# Patient Record
Sex: Female | Born: 1965 | Race: White | Hispanic: No | Marital: Married | Smoking: Never smoker
Health system: Southern US, Community
[De-identification: ages and names within clinical notes are randomized; demographics above are authoritative.]

## PROBLEM LIST (undated history)

## (undated) DIAGNOSIS — F329 Major depressive disorder, single episode, unspecified: Secondary | ICD-10-CM

## (undated) DIAGNOSIS — F32A Depression, unspecified: Secondary | ICD-10-CM

## (undated) DIAGNOSIS — I10 Essential (primary) hypertension: Secondary | ICD-10-CM

## (undated) HISTORY — PX: TONSILLECTOMY: SUR1361

## (undated) HISTORY — PX: OOPHORECTOMY: SHX86

## (undated) HISTORY — PX: ABDOMINAL HYSTERECTOMY: SHX81

## (undated) HISTORY — PX: BREAST CYST EXCISION: SHX579

---

## 2001-02-17 ENCOUNTER — Ambulatory Visit (HOSPITAL_COMMUNITY): Admission: RE | Admit: 2001-02-17 | Discharge: 2001-02-17 | Payer: Self-pay

## 2006-03-30 ENCOUNTER — Emergency Department: Payer: Self-pay | Admitting: Emergency Medicine

## 2006-04-25 ENCOUNTER — Ambulatory Visit: Payer: Self-pay | Admitting: Internal Medicine

## 2007-03-06 ENCOUNTER — Ambulatory Visit: Payer: Self-pay

## 2009-02-03 ENCOUNTER — Ambulatory Visit: Payer: Self-pay | Admitting: Obstetrics and Gynecology

## 2010-02-13 ENCOUNTER — Ambulatory Visit: Payer: Self-pay

## 2011-03-05 ENCOUNTER — Ambulatory Visit: Payer: Self-pay | Admitting: Family Medicine

## 2011-03-08 ENCOUNTER — Ambulatory Visit: Payer: Self-pay | Admitting: Family Medicine

## 2012-01-24 DIAGNOSIS — M24859 Other specific joint derangements of unspecified hip, not elsewhere classified: Secondary | ICD-10-CM | POA: Insufficient documentation

## 2012-01-24 DIAGNOSIS — M25559 Pain in unspecified hip: Secondary | ICD-10-CM | POA: Insufficient documentation

## 2012-12-02 ENCOUNTER — Ambulatory Visit: Payer: Self-pay | Admitting: Family Medicine

## 2012-12-09 DIAGNOSIS — F329 Major depressive disorder, single episode, unspecified: Secondary | ICD-10-CM | POA: Insufficient documentation

## 2014-01-07 ENCOUNTER — Ambulatory Visit: Payer: Self-pay | Admitting: Family Medicine

## 2014-03-30 ENCOUNTER — Ambulatory Visit: Payer: Self-pay | Admitting: Family Medicine

## 2014-10-15 DIAGNOSIS — S61409A Unspecified open wound of unspecified hand, initial encounter: Secondary | ICD-10-CM | POA: Insufficient documentation

## 2014-10-15 DIAGNOSIS — IMO0002 Reserved for concepts with insufficient information to code with codable children: Secondary | ICD-10-CM | POA: Insufficient documentation

## 2014-10-15 DIAGNOSIS — S66999A Other injury of unspecified muscle, fascia and tendon at wrist and hand level, unspecified hand, initial encounter: Secondary | ICD-10-CM

## 2015-06-15 DIAGNOSIS — G5620 Lesion of ulnar nerve, unspecified upper limb: Secondary | ICD-10-CM | POA: Insufficient documentation

## 2015-07-15 DIAGNOSIS — R200 Anesthesia of skin: Secondary | ICD-10-CM | POA: Insufficient documentation

## 2015-09-19 ENCOUNTER — Ambulatory Visit
Admission: EM | Admit: 2015-09-19 | Discharge: 2015-09-19 | Disposition: A | Discharge status: Home / Self Care | Payer: BLUE CROSS/BLUE SHIELD | Attending: Family Medicine | Admitting: Family Medicine

## 2015-09-19 ENCOUNTER — Encounter: Payer: Self-pay | Admitting: Gynecology

## 2015-09-19 DIAGNOSIS — J0100 Acute maxillary sinusitis, unspecified: Secondary | ICD-10-CM | POA: Diagnosis not present

## 2015-09-19 DIAGNOSIS — R05 Cough: Secondary | ICD-10-CM | POA: Diagnosis not present

## 2015-09-19 DIAGNOSIS — R059 Cough, unspecified: Secondary | ICD-10-CM

## 2015-09-19 HISTORY — DX: Depression, unspecified: F32.A

## 2015-09-19 HISTORY — DX: Major depressive disorder, single episode, unspecified: F32.9

## 2015-09-19 HISTORY — DX: Essential (primary) hypertension: I10

## 2015-09-19 MED ORDER — AZITHROMYCIN 250 MG PO TABS: ORAL_TABLET | ORAL | Status: DC

## 2015-09-19 MED ORDER — GUAIFENESIN-CODEINE 100-10 MG/5ML PO SOLN: ORAL | Status: AC

## 2015-09-19 NOTE — ED Notes (Signed)
Patient c/o cough x 10 days. Per patient left side pain when coughing.

## 2015-09-19 NOTE — ED Provider Notes (Signed)
CSN: [id number]     Arrival date & time 09/19/15  1008 History   First MD Initiated Contact with Patient 09/19/15 1111     Chief Complaint  Patient presents with  . Cough   (Consider location/radiation/quality/duration/timing/severity/associated sxs/prior Treatment) Patient is a 50 y.o. female presenting with cough. The history is provided by the patient.  Cough Cough characteristics:  Productive Sputum characteristics:  Yellow Severity:  Moderate Onset quality:  Sudden Duration:  10 days Timing:  Constant Progression:  Worsening Chronicity:  New Smoker: no   Context: upper respiratory infection   Relieved by:  Nothing Associated symptoms: chills, fever, headaches, shortness of breath and sinus congestion     Past Medical History  Diagnosis Date  . Hypertension   . Depression    Past Surgical History  Procedure Laterality Date  . Abdominal hysterectomy    . Tonsillectomy     No family history on file. Social History  Substance Use Topics  . Smoking status: Never Smoker   . Smokeless tobacco: None  . Alcohol Use: Yes   OB History    No data available     Review of Systems  Constitutional: Positive for fever and chills.  Respiratory: Positive for cough and shortness of breath.   Neurological: Positive for headaches.    Allergies  Review of patient's allergies indicates no known allergies.  Home Medications   Prior to Admission medications   Medication Sig Start Date End Date Taking? Authorizing Provider  gabapentin (NEURONTIN) 300 MG capsule Take 300 mg by mouth 3 (three) times daily.   Yes Historical Provider, MD  venlafaxine XR (EFFEXOR-XR) 150 MG 24 hr capsule Take 150 mg by mouth daily with breakfast.   Yes Historical Provider, MD  azithromycin (ZITHROMAX Z-PAK) 250 MG tablet 2 tabs po once day 1, then 1 tab po qd for next 4 days 09/19/15   Payton Mccallum, MD  guaiFENesin-codeine 100-10 MG/5ML syrup 10 ml po q 8 hours prn 09/19/15   Payton Mccallum, MD   Meds  Ordered and Administered this Visit  Medications - No data to display  BP 154/100 mmHg  Pulse 86  Temp(Src) 98.5 F (36.9 C) (Oral)  Resp 18  Ht 5\' 2"  (1.575 m)  Wt 217 lb (98.431 kg)  BMI 39.68 kg/m2  SpO2 100% No data found.   Physical Exam  Constitutional: She appears well-developed and well-nourished. No distress.  HENT:  Head: Normocephalic and atraumatic.  Right Ear: Tympanic membrane, external ear and ear canal normal.  Left Ear: Tympanic membrane, external ear and ear canal normal.  Nose: Mucosal edema and rhinorrhea present. No nose lacerations, sinus tenderness, nasal deformity, septal deviation or nasal septal hematoma. No epistaxis.  No foreign bodies. Right sinus exhibits maxillary sinus tenderness and frontal sinus tenderness. Left sinus exhibits maxillary sinus tenderness and frontal sinus tenderness.  Mouth/Throat: Uvula is midline, oropharynx is clear and moist and mucous membranes are normal. No oropharyngeal exudate.  Eyes: Conjunctivae and EOM are normal. Pupils are equal, round, and reactive to light. Right eye exhibits no discharge. Left eye exhibits no discharge. No scleral icterus.  Neck: Normal range of motion. Neck supple. No thyromegaly present.  Cardiovascular: Normal rate, regular rhythm and normal heart sounds.   Pulmonary/Chest: Effort normal. No respiratory distress. She has no wheezes. She has no rales.  Diffuse rhonchi; worse on left side  Lymphadenopathy:    She has no cervical adenopathy.  Skin: She is not diaphoretic.  Nursing note and vitals reviewed.  ED Course  Procedures (including critical care time)  Labs Review Labs Reviewed - No data to display  Imaging Review No results found.   Visual Acuity Review  Right Eye Distance:   Left Eye Distance:   Bilateral Distance:    Right Eye Near:   Left Eye Near:    Bilateral Near:         MDM   1. Acute maxillary sinusitis, recurrence not specified   2. Cough      Discharge Medication List as of 09/19/2015 11:21 AM    START taking these medications   Details  azithromycin (ZITHROMAX Z-PAK) 250 MG tablet 2 tabs po once day 1, then 1 tab po qd for next 4 days, Normal    guaiFENesin-codeine 100-10 MG/5ML syrup 10 ml po q 8 hours prn, Print       1. diagnosis reviewed with patient 2. rx as per orders above; reviewed possible side effects, interactions, risks and benefits  3. Recommend supportive treatment with otc flonase, otc analgesics prn, increased fluids 4. Follow-up prn if symptoms worsen or don't improve    Payton Mccallumrlando Jasiel Apachito, MD 09/19/15 1209

## 2015-12-30 ENCOUNTER — Telehealth: Payer: Self-pay | Admitting: Gastroenterology

## 2015-12-30 NOTE — Telephone Encounter (Signed)
Left a voice message at work for patient to call back and schedule appointment with Dr. Servando SnareWohl for abdominal pain, right upper quadrant. I called her cell phone 4/20 abd 4/21 but her mailbox is full and I can't leave a message

## 2016-02-07 ENCOUNTER — Ambulatory Visit: Payer: Self-pay | Admitting: Gastroenterology

## 2016-02-27 ENCOUNTER — Other Ambulatory Visit: Payer: Self-pay | Admitting: Family Medicine

## 2016-02-27 DIAGNOSIS — Z1231 Encounter for screening mammogram for malignant neoplasm of breast: Secondary | ICD-10-CM

## 2016-02-29 DIAGNOSIS — G5601 Carpal tunnel syndrome, right upper limb: Secondary | ICD-10-CM | POA: Insufficient documentation

## 2016-02-29 DIAGNOSIS — Z85828 Personal history of other malignant neoplasm of skin: Secondary | ICD-10-CM | POA: Insufficient documentation

## 2016-02-29 DIAGNOSIS — J4520 Mild intermittent asthma, uncomplicated: Secondary | ICD-10-CM | POA: Insufficient documentation

## 2016-03-01 ENCOUNTER — Encounter: Payer: Self-pay | Admitting: Gastroenterology

## 2016-03-01 ENCOUNTER — Other Ambulatory Visit: Payer: Self-pay

## 2016-03-01 ENCOUNTER — Ambulatory Visit (INDEPENDENT_AMBULATORY_CARE_PROVIDER_SITE_OTHER): Payer: BLUE CROSS/BLUE SHIELD | Admitting: Gastroenterology

## 2016-03-01 VITALS — BP 154/89 | HR 91 | Temp 98.0°F | Ht 62.0 in | Wt 237.0 lb

## 2016-03-01 DIAGNOSIS — R1010 Upper abdominal pain, unspecified: Secondary | ICD-10-CM

## 2016-03-01 DIAGNOSIS — K219 Gastro-esophageal reflux disease without esophagitis: Secondary | ICD-10-CM | POA: Diagnosis not present

## 2016-03-01 DIAGNOSIS — R1011 Right upper quadrant pain: Principal | ICD-10-CM

## 2016-03-01 DIAGNOSIS — G8929 Other chronic pain: Secondary | ICD-10-CM | POA: Diagnosis not present

## 2016-03-01 NOTE — Progress Notes (Signed)
Gastroenterology Consultation  Referring Provider:     Rolm GalaGrandis, Heidi, MD Primary Care Physician:  Rolm GalaGRANDIS, HEIDI, MD Primary Gastroenterologist:  Dr. Servando SnareWohl     Reason for Consultation:     Abdominal pain        HPI:   Jennifer Todd is a 50 y.o. y/o female referred for consultation & management of Abdominal pain by Dr. Gavin PottersGRANDIS, Trudie BucklerHEIDI, MD.  This patient comes here today with a report of abdominal pain that started in the epigastric area and radiated to the right.  The patient states that she has had daily heartburn for the last few months but was put on a PPI and her heartburn has completely resolved.  Since starting the PPI she states she has only had 2 attacks of pain in the right side which is a stabbing pain.  There is no report of any unexplained weight loss, fevers, chills, nausea or vomiting. She denies ever having these symptoms prior to the last few months.  Past Medical History  Diagnosis Date  . Hypertension   . Depression     Past Surgical History  Procedure Laterality Date  . Abdominal hysterectomy    . Tonsillectomy      Prior to Admission medications   Medication Sig Start Date End Date Taking? Authorizing Provider  albuterol [facility](PROAIR HFA) [phone](90 Base) MCG/ACT inhaler  12/23/15 12/22/16 Yes Historical Provider, MD  EPINEPHrine (EPIPEN 2-PAK) 0.3 mg/0.3 mL IJ SOAJ injection  12/23/15  Yes Historical Provider, MD  pantoprazole (PROTONIX) 40 MG tablet  12/23/15  Yes Historical Provider, MD  SUMAtriptan (IMITREX) 100 MG tablet  02/27/16  Yes Historical Provider, MD  azithromycin (ZITHROMAX Z-PAK) 250 MG tablet 2 tabs po once day 1, then 1 tab po qd for next 4 days 09/19/15   Payton Mccallumrlando Conty, MD  gabapentin (NEURONTIN) 300 MG capsule Take 300 mg by mouth 3 (three) times daily.    Historical Provider, MD  guaiFENesin-codeine 100-10 MG/5ML syrup 10 ml po q 8 hours prn 09/19/15   Payton Mccallumrlando Conty, MD  Multiple Vitamin (MULTIVITAMIN) capsule Take by mouth.    Historical Provider, MD    venlafaxine XR (EFFEXOR-XR) 150 MG 24 hr capsule Take 150 mg by mouth daily with breakfast.    Historical Provider, MD    Family History  Problem Relation Age of Onset  . Cancer Father   . Hypertension Father   . Diabetes Father   . Hypothyroidism Mother      Social History  Substance Use Topics  . Smoking status: Never Smoker   . Smokeless tobacco: Never Used  . Alcohol Use: Yes    Allergies as of 03/01/2016 - Review Complete 03/01/2016  Allergen Reaction Noted  . Latex Rash 02/29/2016  . Oxycodone Hives and Rash 02/29/2016    Review of Systems:    All systems reviewed and negative except where noted in HPI.   Physical Exam:  BP 154/89 mmHg  Pulse 91  Temp(Src) 98 F (36.7 C) (Oral)  Ht 5\' 2"  (1.575 m)  Wt 237 lb (107.502 kg)  BMI 43.34 kg/m2 No LMP recorded. Patient has had a hysterectomy. Psych:  Alert and cooperative. Normal mood and affect. General:   Alert,  Well-developed, Obese,well-nourished, pleasant and cooperative in NAD Head:  Normocephalic and atraumatic. Eyes:  Sclera clear, no icterus.   Conjunctiva pink. Ears:  Normal auditory acuity. Nose:  No deformity, discharge, or lesions. Mouth:  No deformity or lesions,oropharynx pink & moist. Neck:  Supple; no masses  or thyromegaly. Lungs:  Respirations even and unlabored.  Clear throughout to auscultation.   No wheezes, crackles, or rhonchi. No acute distress. Heart:  Regular rate and rhythm; no murmurs, clicks, rubs, or gallops. Abdomen:  Normal bowel sounds.  No bruits.  Soft, non-tender and non-distended without masses, hepatosplenomegaly or hernias noted.  No guarding or rebound tenderness.  Negative Carnett sign.   Rectal:  Deferred.  Msk:  Symmetrical without gross deformities.  Good, equal movement & strength bilaterally. Pulses:  Normal pulses noted. Extremities:  No clubbing or edema.  No cyanosis. Neurologic:  Alert and oriented x3;  grossly normal neurologically. Skin:  Intact without  significant lesions or rashes.  No jaundice. Lymph Nodes:  No significant cervical adenopathy. Psych:  Alert and cooperative. Normal mood and affect.  Imaging Studies: No results found.  Assessment and Plan:   Jennifer Todd is a 50 y.o. y/o female Who comes in today with a history of heartburn and right upper quadrant pain.  The patient has no reproducible pain at the present time.  She states that it has also gotten better since she started taking her PPI.  Due to the patient's symptoms and obesity I have explained to her that I'm ordering a right upper quadrant ultrasound to make sure she does not have any gallbladder disease.  Otherwise the patient has been told that she will have no further workup because the pain has improved to the point where she is only had 2 episodes recently.  The patient has been explained the plan and agrees with it.   Note: This dictation was prepared with Dragon dictation along with smaller phrase technology. Any transcriptional errors that result from this process are unintentional.

## 2016-03-01 NOTE — Patient Instructions (Signed)
[facility] at 9:30am. Nothing to eat or drink after midnight.

## 2016-03-19 ENCOUNTER — Ambulatory Visit
Admission: RE | Admit: 2016-03-19 | Discharge: 2016-03-19 | Disposition: A | Discharge status: Home / Self Care | Payer: BLUE CROSS/BLUE SHIELD | Source: Ambulatory Visit | Attending: Gastroenterology | Admitting: Gastroenterology

## 2016-03-19 ENCOUNTER — Ambulatory Visit
Admission: RE | Admit: 2016-03-19 | Discharge: 2016-03-19 | Disposition: A | Discharge status: Home / Self Care | Payer: BLUE CROSS/BLUE SHIELD | Source: Ambulatory Visit | Attending: Family Medicine | Admitting: Family Medicine

## 2016-03-19 DIAGNOSIS — Z1231 Encounter for screening mammogram for malignant neoplasm of breast: Secondary | ICD-10-CM | POA: Diagnosis not present

## 2016-03-19 DIAGNOSIS — R1011 Right upper quadrant pain: Secondary | ICD-10-CM | POA: Insufficient documentation

## 2016-03-20 ENCOUNTER — Telehealth: Payer: Self-pay

## 2016-03-20 NOTE — Telephone Encounter (Signed)
-----   Message from Midge Miniumarren Wohl, MD sent at 03/19/2016 12:16 PM EDT ----- Let the patient know that the ultrasound was normal and no further workup is needed unless she has any further symptoms.

## 2016-03-20 NOTE — Telephone Encounter (Signed)
Left vm letting pt know the results of US and to contact me with any further issues.

## 2018-06-18 ENCOUNTER — Other Ambulatory Visit: Payer: Self-pay | Admitting: Family Medicine

## 2018-06-18 DIAGNOSIS — Z1231 Encounter for screening mammogram for malignant neoplasm of breast: Secondary | ICD-10-CM

## 2018-06-30 ENCOUNTER — Ambulatory Visit
Admission: RE | Admit: 2018-06-30 | Discharge: 2018-06-30 | Disposition: A | Discharge status: Home / Self Care | Payer: BLUE CROSS/BLUE SHIELD | Source: Ambulatory Visit | Attending: Family Medicine | Admitting: Family Medicine

## 2018-06-30 DIAGNOSIS — Z1231 Encounter for screening mammogram for malignant neoplasm of breast: Secondary | ICD-10-CM | POA: Insufficient documentation

## 2019-01-05 IMAGING — MG DIGITAL SCREENING BILATERAL MAMMOGRAM WITH TOMO AND CAD
8 series · 9 of 24 positions shown · non-contrast
Comparison: Previous exam(s).

CLINICAL DATA: Screening. History of 102 pound weight loss
following gastric bypass surgery.

EXAM:
DIGITAL SCREENING BILATERAL MAMMOGRAM WITH TOMO AND CAD

[R MLO synth-2D]
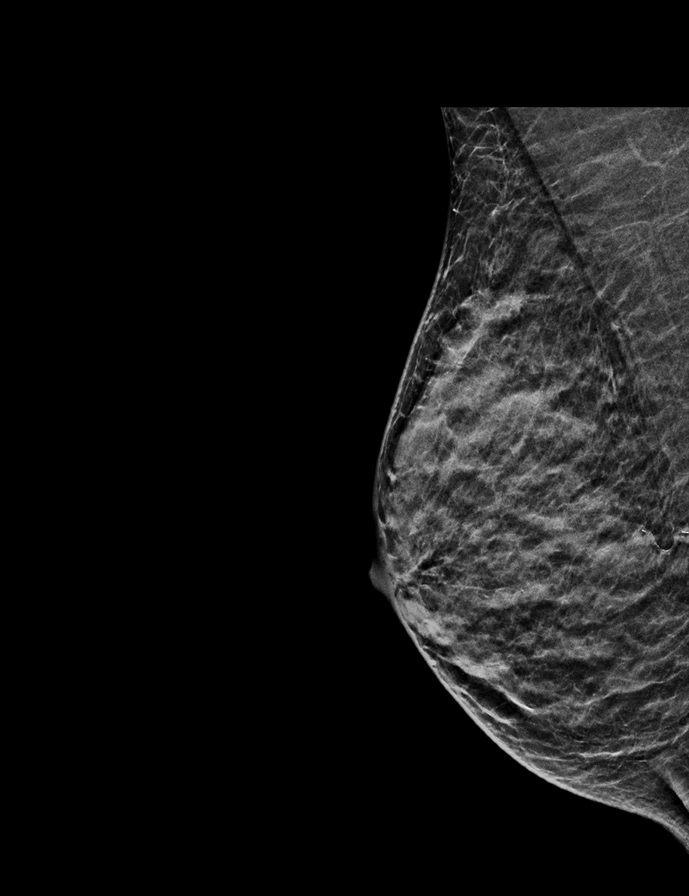

[L MLO synth-2D]
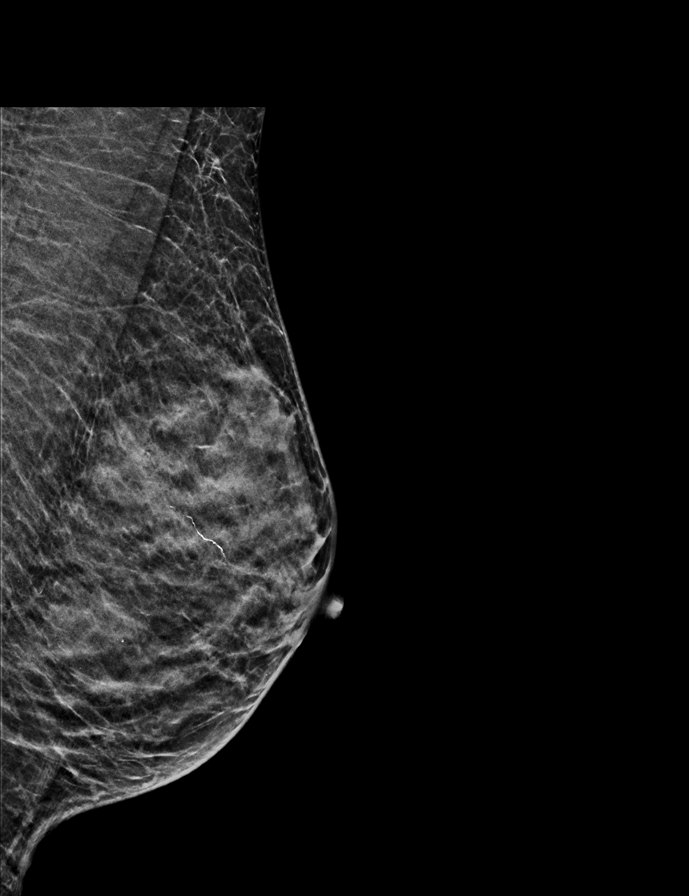

[L CC synth-2D]
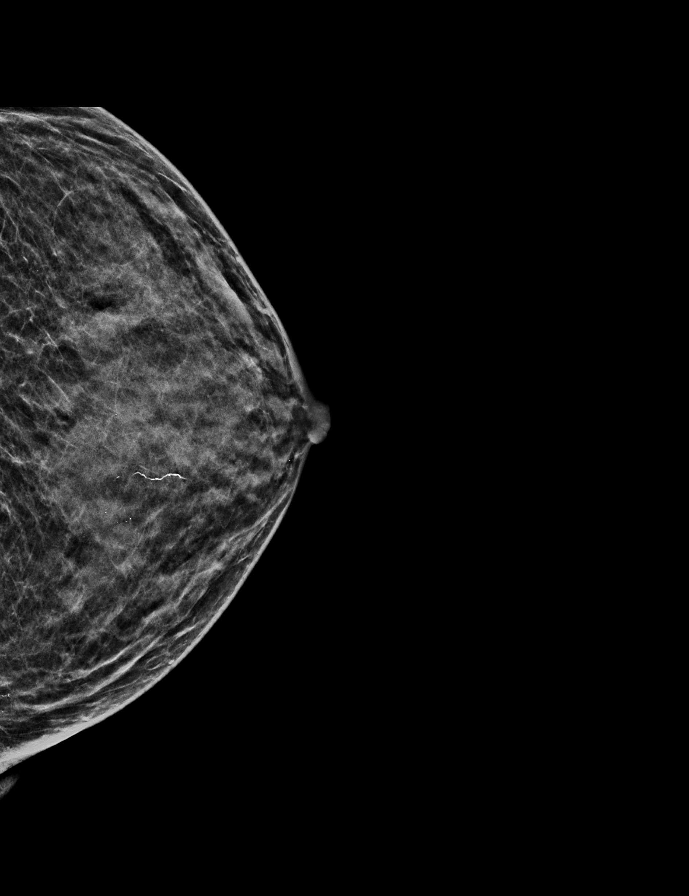

[R CC synth-2D]
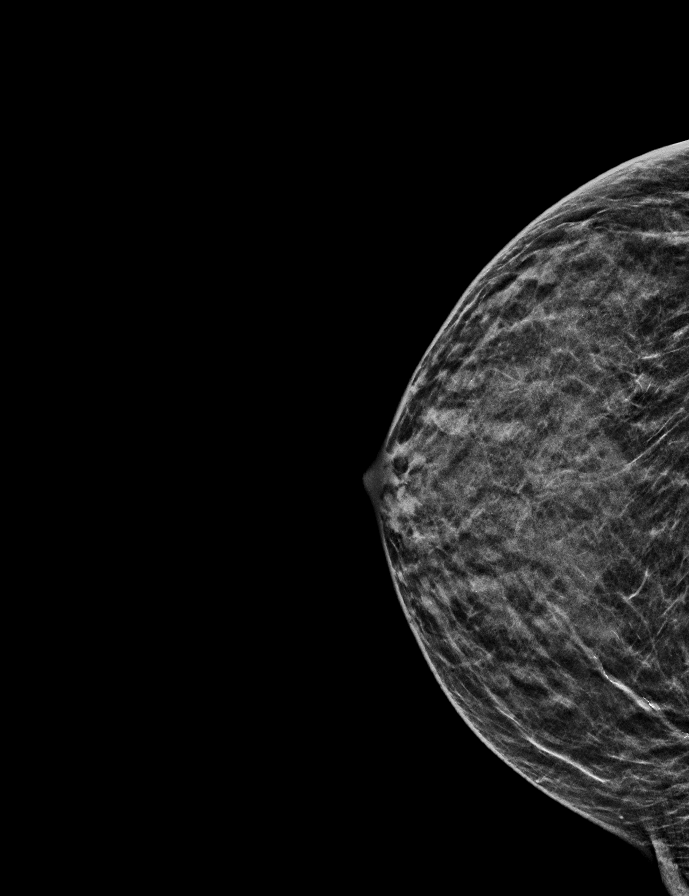

[L CC tomo · 2 of 33 frames shown]
[frame 11/33]
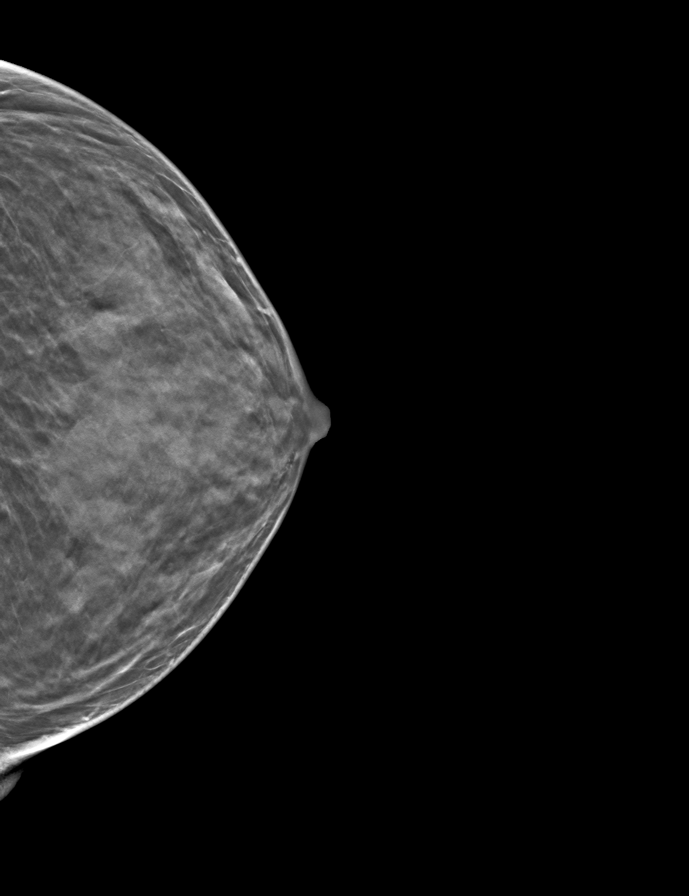
[frame 17/33]
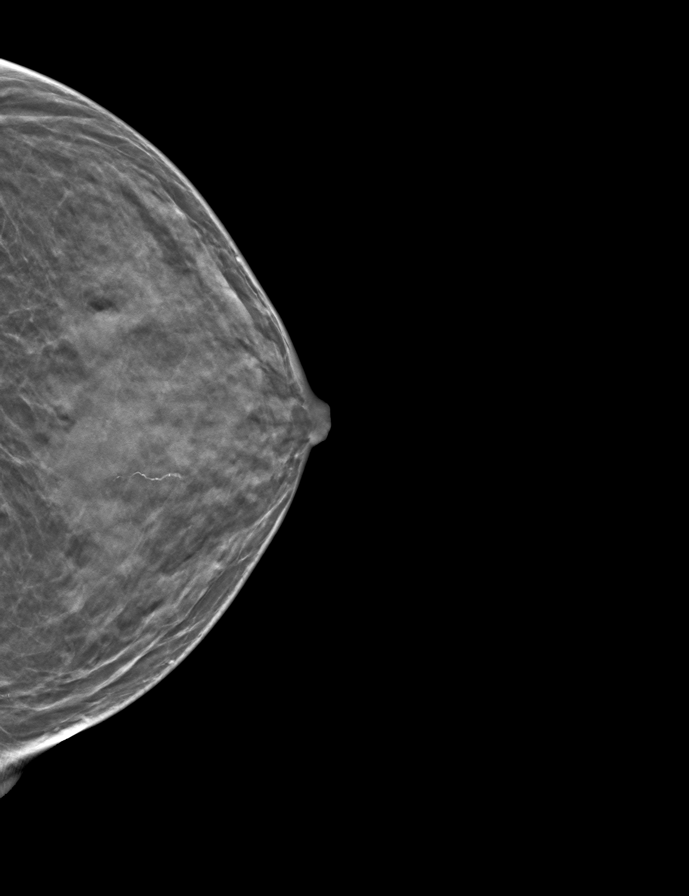

[L MLO tomo · tomo slice 18/35.0]
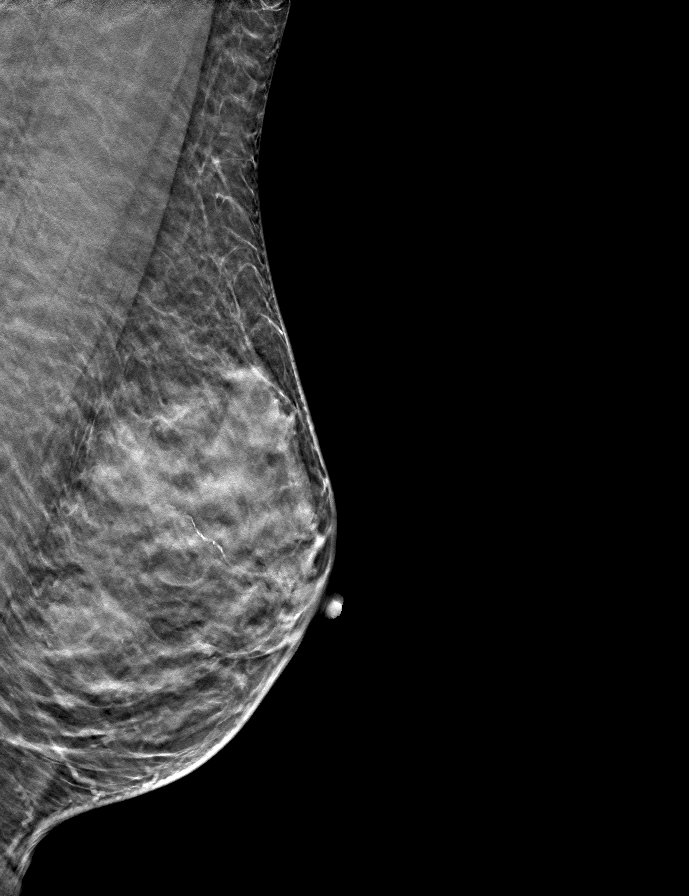

[R MLO tomo · tomo slice 17/32.0]
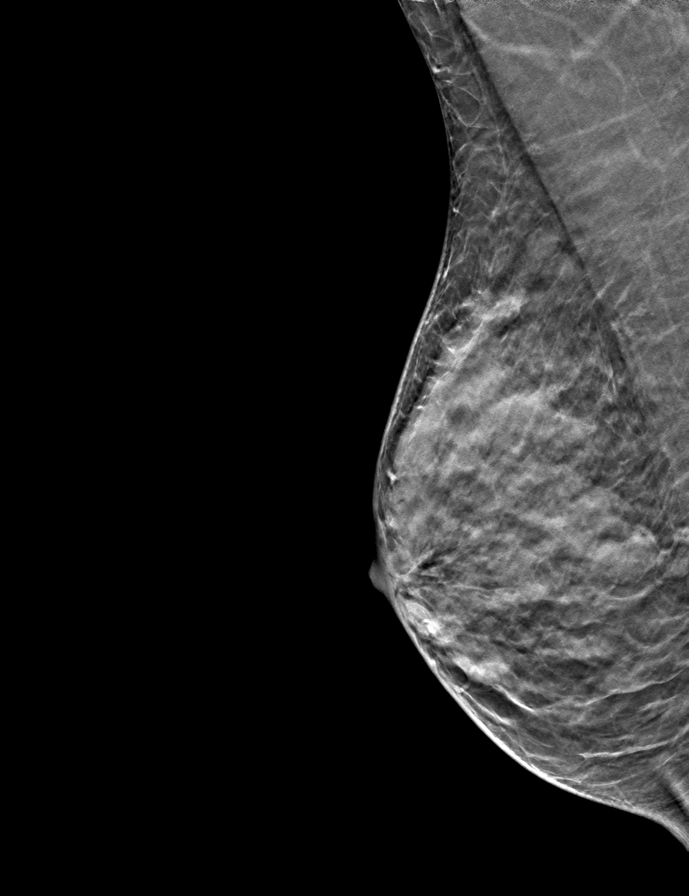

[R CC tomo · tomo slice 16/31.0]
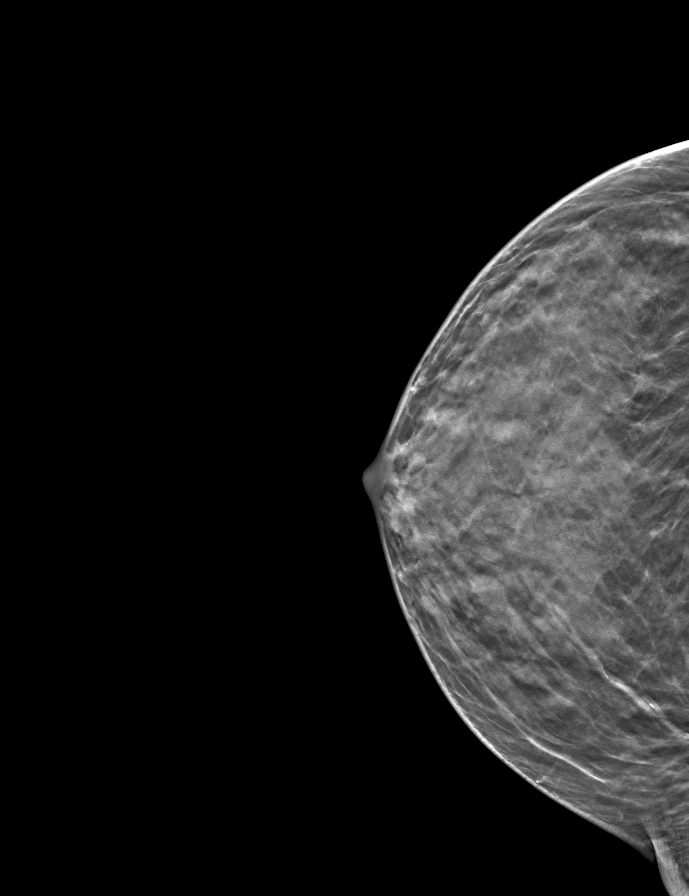

[9 of 24 positions shown; findings below may reference images not displayed]

ACR Breast Density Category d: The breast tissue is extremely dense,
which lowers the sensitivity of mammography.
FINDINGS: There are no findings suspicious for malignancy. Images were
processed with CAD.
IMPRESSION: No mammographic evidence of malignancy. A result letter of this
screening mammogram will be mailed directly to the patient.

RECOMMENDATION:
Screening mammogram in one year. (Code:64-B-5NQ)

BI-RADS CATEGORY  1: Negative.

## 2022-06-09 ENCOUNTER — Ambulatory Visit
Admission: EM | Admit: 2022-06-09 | Discharge: 2022-06-09 | Disposition: A | Discharge status: Home / Self Care | Payer: BC Managed Care – PPO | Attending: Emergency Medicine | Admitting: Emergency Medicine

## 2022-06-09 ENCOUNTER — Ambulatory Visit (INDEPENDENT_AMBULATORY_CARE_PROVIDER_SITE_OTHER): Payer: BC Managed Care – PPO

## 2022-06-09 ENCOUNTER — Encounter: Payer: Self-pay | Admitting: Emergency Medicine

## 2022-06-09 DIAGNOSIS — B9689 Other specified bacterial agents as the cause of diseases classified elsewhere: Secondary | ICD-10-CM

## 2022-06-09 DIAGNOSIS — J208 Acute bronchitis due to other specified organisms: Secondary | ICD-10-CM | POA: Diagnosis not present

## 2022-06-09 MED ORDER — AZITHROMYCIN 250 MG PO TABS: ORAL_TABLET | ORAL | 0 refills | Status: AC

## 2022-06-09 NOTE — ED Provider Notes (Signed)
[facility] [place]   Name: Jennifer Todd DOB: [birth year] MRN: [id number] CSN: [id number] PCP: Rolm Gala, MD  Arrival date and time:  06/09/22 1359  Chief Complaint:  Cough   NOTE: Prior to seeing the patient today, I have reviewed the triage nursing documentation and vital signs. Clinical staff has updated patient's PMH/PSHx, current medication list, and drug allergies/intolerances to ensure comprehensive history available to assist in medical decision making.   History:   HPI: Jennifer Todd is a 55 y.o. female who presents today with complaints of productive cough and chest congestion.  Patient states the symptoms have been ongoing for rater than sign 1 week.  Patient states the symptoms started on Wednesday of last week after receiving her flu shot.  Prior to using her flu shot, she attended a large conference at which multiple conference attendees were sick shortly afterwards.  She was evaluated by her primary care provider and she was given an 8-day steroid taper.  She completed her steroids yesterday and symptoms immediately returned.  She denies any fevers, chills, or body aches.  She does endorse decreased appetite and sleep due to her continuous coughing.  She has tried over-the-counter cough medications such as Mucinex and Delsym and has noted little to no relief.   Past Medical History:  Diagnosis Date   Depression    Hypertension     Past Surgical History:  Procedure Laterality Date   ABDOMINAL HYSTERECTOMY     BREAST CYST EXCISION Left    cyst removal at age 39-neg   OOPHORECTOMY     TONSILLECTOMY      Family History  Problem Relation Age of Onset   Cancer Father    Hypertension Father    Diabetes Father    Hypothyroidism Mother    Breast cancer Maternal Aunt 43   Breast cancer Paternal Grandmother     Social History   Tobacco Use   Smoking status: Never   Smokeless tobacco: Never  Vaping Use   Vaping Use: Never used   Substance Use Topics   Alcohol use: Yes   Drug use: No    Patient Active Problem List   Diagnosis Date Noted   Asthma, mild intermittent 02/29/2016   H/O malignant neoplasm of skin 02/29/2016   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    Allergies:   Latex and Oxycodone  Review of Systems (ROS): Review of Systems  Constitutional:  Positive for appetite change and fatigue. Negative for activity change, chills and diaphoresis.  HENT:  Negative for congestion, postnasal drip, sinus pain, sneezing and sore throat.   Respiratory:  Positive for cough, shortness of breath and wheezing.   Cardiovascular:  Negative for chest pain.  Gastrointestinal:  Negative for diarrhea, nausea and vomiting.  All other systems reviewed and are negative.    Vital Signs: Today's Vitals   06/09/22 1413 06/09/22 1417  BP:  119/84  Pulse:  92  Resp:  14  Temp:  98.1 F (36.7 C)  TempSrc:  Oral  SpO2:  99%  Weight: 236 lb 15.9 oz (107.5 kg)   Height: 5\' 2"  (1.575 m)   PainSc: 3      Physical Exam: Physical Exam Vitals and nursing note reviewed.  Constitutional:      Appearance: Normal appearance.  HENT:     Right Ear: Tympanic membrane normal.     Left Ear: Tympanic membrane normal.  Eyes:     Pupils: Pupils are equal, round, and reactive to light.  Cardiovascular:     Rate and Rhythm: Normal rate and regular rhythm.     Pulses: Normal pulses.     Heart sounds: Normal heart sounds.   Pulmonary:     Effort: Pulmonary effort is normal.     Breath sounds: Examination of the left-lower field reveals rhonchi and rales. Rhonchi and rales present. No wheezing.  Lymphadenopathy:     Cervical: No cervical adenopathy.  Skin:    General: Skin is warm and dry.  Neurological:     General: No focal deficit present.     Mental Status: She is alert and oriented to person, place, and time.  Psychiatric:        Mood and Affect: Mood normal.        Behavior: Behavior normal.      Urgent Care Treatments / Results:   LABS: PLEASE NOTE: all labs that were ordered this encounter are listed, however only abnormal results are displayed. Labs Reviewed - No data to display  EKG: -None  RADIOLOGY: DG Chest 2 View  Result Date: 06/09/2022 CLINICAL DATA:  Cough EXAM: CHEST - 2 VIEW COMPARISON:  CT done on 03/30/2014 FINDINGS: Cardiac size is within normal limits. Thoracic aorta is tortuous. Lung fields are clear of any infiltrates or pulmonary edema. Small linear density in medial left lower lung field may suggest scarring or subsegmental atelectasis. There is no pleural effusion or pneumothorax. IMPRESSION: There are no signs of pulmonary edema or focal pulmonary consolidation. Small linear density in the medial left lower lung field suggests scarring or subsegmental atelectasis. Electronically Signed   By: Elmer Picker M.D.   On: 06/09/2022 15:09    PROCEDURES: Procedures  MEDICATIONS RECEIVED THIS VISIT: Medications - No data to display  PERTINENT CLINICAL COURSE NOTES/UPDATES:   Initial Impression / Assessment and Plan / Urgent Care Course:  Pertinent labs & imaging results that were available during my care of the patient were personally reviewed by me and considered in my medical decision making (see lab/imaging section of note for values and interpretations).  Jennifer Todd is a 56 y.o. female who presents to [facility] today with complaints of cough and  chest congestion, diagnosed with bacterial bronchitis, and treated as such with the medications below. NP and patient reviewed discharge instructions below during visit.   Patient is well appearing overall in clinic today. She does not appear to be in any acute distress. Presenting symptoms (see HPI) and exam as documented above.   I have reviewed the follow up and strict return precautions for any new or worsening symptoms. Patient is aware of symptoms that would be deemed urgent/emergent, and would thus require further evaluation either here or in the emergency department. At the time of discharge, she verbalized understanding and consent with the discharge plan as it was reviewed with her. All questions were fielded by provider and/or clinic staff prior to patient discharge.  Final Clinical Impressions / Urgent Care Diagnoses:   Final diagnoses:  Acute bacterial bronchitis    New Prescriptions:  [place] Controlled Substance Registry consulted? Not Applicable  Meds ordered this encounter  Medications   azithromycin (ZITHROMAX Z-PAK) 250 MG tablet    Sig: 2 tabs po once day 1, then 1 tab po qd for next 4 days    Dispense:  6 each    Refill:  0      Discharge Instructions      You were seen for cough and are being treated for bacterial bronchitis.   - Take the antibiotics as prescribed until they're finished. If you think you're having a reaction, stop the medication, take benadryl and go to the nearest urgent care/emergency room. Take a probiotic while taking the antibiotic to decrease the chances of stomach upset.  -Use your albuterol inhaler every 4 hours for 2 days, then change to as needed. -Continue symptomatic care: Mucinex and dexamethasone as needed.  Take care, Dr. Sharlet Salina, NP-c     Recommended Follow up Care:  Patient encouraged to follow up with the following provider within the specified time frame, or sooner as dictated by the severity of her symptoms. As always,  she was instructed that for any urgent/emergent care needs, she should seek care either here or in the emergency department for more immediate evaluation.   Bailey Mech, DNP, NP-c   Bailey Mech, NP 06/09/22 [phone]

## 2022-06-09 NOTE — Discharge Instructions (Signed)
You were seen for cough and are being treated for bacterial bronchitis.   - Take the antibiotics as prescribed until they're finished. If you think you're having a reaction, stop the medication, take benadryl and go to the nearest urgent care/emergency room. Take a probiotic while taking the antibiotic to decrease the chances of stomach upset.  -Use your albuterol inhaler every 4 hours for 2 days, then change to as needed. -Continue symptomatic care: Mucinex and dexamethasone as needed.  Take care, Dr. Marland Kitchen, NP-c

## 2022-06-09 NOTE — ED Triage Notes (Signed)
Patient states that she got a flu vaccine last Wed.  Patient states that she has had cough and chest congestion for over a week.  Patient unsure of fevers.  Patient states that she finished her Prednisone yesterday.

## 2022-09-10 ENCOUNTER — Encounter: Payer: Self-pay | Admitting: Emergency Medicine

## 2022-09-10 ENCOUNTER — Ambulatory Visit
Admission: EM | Admit: 2022-09-10 | Discharge: 2022-09-10 | Disposition: A | Discharge status: Home / Self Care | Payer: BC Managed Care – PPO | Attending: Physician Assistant | Admitting: Physician Assistant

## 2022-09-10 DIAGNOSIS — H00014 Hordeolum externum left upper eyelid: Secondary | ICD-10-CM

## 2022-09-10 MED ORDER — DOXYCYCLINE HYCLATE 100 MG PO CAPS: 100.0000 mg | ORAL_CAPSULE | Freq: Two times a day (BID) | ORAL | 0 refills | Status: AC

## 2022-09-10 MED ORDER — ERYTHROMYCIN 5 MG/GM OP OINT: TOPICAL_OINTMENT | OPHTHALMIC | 0 refills | Status: AC

## 2022-09-10 NOTE — Discharge Instructions (Addendum)
-  You have a significant stye.  I have sent oral antibiotics and an ointment to the pharmacy.  Continue with warm compresses multiple times throughout the day, every 2 hours or so. - If you develop a fever or have increased swelling of your eyelid or pain you should go to the ER. - If no improvement in the next few days you may need to see ophthalmology and have this drained.

## 2022-09-10 NOTE — ED Triage Notes (Signed)
Pt presents with a stye on her left eye x 1 week. She did a telehealth visit on 09/06/22 and prescribed eye drops.

## 2022-09-10 NOTE — ED Provider Notes (Signed)
[facility]    CSN: [id number] Arrival date & time: 09/10/22  1227      History   Chief Complaint Chief Complaint  Patient presents with   Stye    HPI Jennifer Todd is a 57 y.o. female senting for a large stye of the left upper eyelid for the past week.  Patient says it is draining pustular material.  She reports having a telemedicine visit a few days ago and being prescribed Polytrim which she says has been ineffective.  She is also been performing warm compresses.  She reports some pain and pain when she moves her eye.  No associated fever.  Symptoms continue to worsen instead of improve.  She does have an ophthalmologist and a PCP.  No other complaints.  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      Home Medications    Prior to Admission medications   Medication Sig Start Date End Date Taking? Authorizing Provider  doxycycline (VIBRAMYCIN) 100 MG capsule Take 1 capsule (100 mg total) by mouth 2 (two) times daily for 7 days. 09/10/22 09/17/22 Yes Laurene Footman B, PA-C  erythromycin ophthalmic ointment Place a 1/2 inch ribbon of ointment into the lower eyelid q6h 09/10/22 09/17/22 Yes Danton Clap, PA-C  albuterol [facility] HFA) 108 [phone] Base) MCG/ACT inhaler  12/23/15  06/09/22  [provider]  azithromycin (ZITHROMAX Z-PAK) 250 MG tablet 2 tabs po once day 1, then 1 tab po qd for next 4 days 06/09/22   Gertie Baron, NP  EPINEPHrine (EPIPEN 2-PAK) 0.3 mg/0.3 mL IJ SOAJ injection  12/23/15   [provider]  gabapentin (NEURONTIN) 300 MG capsule Take 300 mg by mouth 3 (three) times daily.    [provider]  guaiFENesin-codeine 100-10 MG/5ML syrup 10 ml po q 8 hours prn 09/19/15   Norval Gable, MD  Multiple Vitamin (MULTIVITAMIN) capsule Take by mouth.    [provider]  pantoprazole (PROTONIX) 40 MG tablet  12/23/15   [provider]  SUMAtriptan (IMITREX) 100 MG tablet  02/27/16   [provider]  venlafaxine XR (EFFEXOR-XR) 150 MG 24 hr capsule Take 150 mg by mouth daily with breakfast.    [provider]    Family History Family History  Problem Relation Age of Onset   Cancer Father    Hypertension Father    Diabetes Father    Hypothyroidism Mother    Breast cancer Maternal Aunt 81   Breast cancer Paternal Grandmother     Social History Social History   Tobacco Use   Smoking status: Never   Smokeless tobacco: Never  Vaping Use   Vaping Use: Never used  Substance  Use Topics   Alcohol use: Yes   Drug use: No     Allergies   Latex, Nsaids, and Oxycodone   Review of Systems Review of Systems  Constitutional:  Negative for fatigue and fever.  HENT:  Positive for facial swelling.   Eyes:  Positive for pain, discharge and redness. Negative for photophobia, itching and visual disturbance.  Neurological:  Negative for weakness and headaches.     Physical Exam Triage Vital Signs ED Triage Vitals  Enc Vitals Group     BP 09/10/22 1450 119/79     Pulse Rate 09/10/22 1450 71     Resp 09/10/22 1450 16     Temp 09/10/22 1450 97.7 F (36.5 C)     Temp Source 09/10/22 1450 Oral     SpO2 09/10/22 1450 100 %     Weight --      Height --      Head Circumference --       Peak Flow --      Pain Score 09/10/22 1449 4     Pain Loc --      Pain Edu? --      Excl. in GC? --    No data found.  Updated Vital Signs BP 119/79 (BP Location: Left Arm)   Pulse 71   Temp 97.7 F (36.5 C) (Oral)   Resp 16   SpO2 100%   Visual Acuity Right Eye Distance:   Left Eye Distance:   Bilateral Distance:    Physical Exam Vitals and nursing note reviewed.  Constitutional:      General: She is not in acute distress.    Appearance: Normal appearance. She is not ill-appearing or toxic-appearing.  HENT:     Head: Normocephalic and atraumatic.     Nose: Nose normal.     Mouth/Throat:     Mouth: Mucous membranes are moist.     Pharynx: Oropharynx is clear.  Eyes:     General: No scleral icterus.       Right eye: No discharge.        Left eye: Hordeolum present.No discharge.     Conjunctiva/sclera:     Left eye: Left conjunctiva is injected.  Cardiovascular:     Rate and Rhythm: Normal rate and regular rhythm.  Pulmonary:     Effort: Pulmonary effort is normal. No respiratory distress.  Musculoskeletal:     Cervical back: Neck supple.  Skin:    General: Skin is dry.  Neurological:     General: No focal deficit present.     Mental Status: She is alert. Mental status is at baseline.     Motor: No weakness.     Gait: Gait normal.  Psychiatric:        Mood and Affect: Mood normal.        Behavior: Behavior normal.        Thought Content: Thought content normal.      UC Treatments / Results  Labs (all labs ordered are listed, but only abnormal results are displayed) Labs Reviewed - No data to display  EKG   Radiology No results found.  Procedures Procedures (including critical care time)  Medications Ordered in UC Medications - No data to display  Initial Impression / Assessment and Plan / UC Course  I have reviewed the triage vital signs and the nursing notes.  Pertinent labs & imaging results that were available during my care of the  patient were reviewed by me and considered  in my medical decision making (see chart for details).   57 year old female presents for a large tire of the left upper eyelid for the past week.  Symptoms have been worsening and not improving despite using warm compresses and Polytrim eyedrops.  Denies fever.  She is afebrile and overall well-appearing.  I included an image in the chart of patient's side which is large.  There is moderate to significant erythema and swelling of the upper eyelid.  Area is exquisitely tender to palpation.  Given that her symptoms have worsened each day and she does have a significant stye, will cover with antibiotics.  Sent doxycycline to pharmacy.  Less than erythromycin ophthalmic ointment.  Advised patient if no improvement in the next couple days follow-up with ophthalmology or PCP.  Advised her it may need to be incised and drained if no improvement on oral antibiotics and with continued use of warm compresses.  ED precautions discussed.  Final Clinical Impressions(s) / UC Diagnoses   Final diagnoses:  Hordeolum externum of left upper eyelid     Discharge Instructions      -You have a significant stye.  I have sent oral antibiotics and an ointment to the pharmacy.  Continue with warm compresses multiple times throughout the day, every 2 hours or so. - If you develop a fever or have increased swelling of your eyelid or pain you should go to the ER. - If no improvement in the next few days you may need to see ophthalmology and have this drained.    ED Prescriptions     Medication Sig Dispense Auth. Provider   doxycycline (VIBRAMYCIN) 100 MG capsule Take 1 capsule (100 mg total) by mouth 2 (two) times daily for 7 days. 14 capsule Laurene Footman B, PA-C   erythromycin ophthalmic ointment Place a 1/2 inch ribbon of ointment into the lower eyelid q6h 3.5 g Danton Clap, PA-C      PDMP not reviewed this encounter.   Danton Clap, PA-C 09/10/22 1524
